# Patient Record
Sex: Female | Born: 1995 | Race: White | Hispanic: No | Marital: Married | Smoking: Never smoker
Health system: Southern US, Community
[De-identification: ages and names within clinical notes are randomized; demographics above are authoritative.]

## PROBLEM LIST (undated history)

## (undated) DIAGNOSIS — J45990 Exercise induced bronchospasm: Secondary | ICD-10-CM

## (undated) DIAGNOSIS — T7840XA Allergy, unspecified, initial encounter: Secondary | ICD-10-CM

## (undated) DIAGNOSIS — L309 Dermatitis, unspecified: Secondary | ICD-10-CM

## (undated) HISTORY — DX: Exercise induced bronchospasm: J45.990

## (undated) HISTORY — DX: Dermatitis, unspecified: L30.9

## (undated) HISTORY — DX: Allergy, unspecified, initial encounter: T78.40XA

## (undated) HISTORY — PX: NO PAST SURGERIES: SHX2092

---

## 2013-11-24 DIAGNOSIS — J45990 Exercise induced bronchospasm: Secondary | ICD-10-CM | POA: Insufficient documentation

## 2016-11-27 ENCOUNTER — Ambulatory Visit (INDEPENDENT_AMBULATORY_CARE_PROVIDER_SITE_OTHER): Payer: PRIVATE HEALTH INSURANCE | Admitting: Physician Assistant

## 2016-11-27 ENCOUNTER — Encounter: Payer: Self-pay | Admitting: Physician Assistant

## 2016-11-27 VITALS — BP 133/83 | HR 84 | Ht 62.0 in | Wt 137.0 lb

## 2016-11-27 DIAGNOSIS — N6323 Unspecified lump in the left breast, lower outer quadrant: Secondary | ICD-10-CM | POA: Insufficient documentation

## 2016-11-27 DIAGNOSIS — R21 Rash and other nonspecific skin eruption: Secondary | ICD-10-CM | POA: Diagnosis not present

## 2016-11-27 DIAGNOSIS — S2002XA Contusion of left breast, initial encounter: Secondary | ICD-10-CM | POA: Diagnosis not present

## 2016-11-27 DIAGNOSIS — Z975 Presence of (intrauterine) contraceptive device: Secondary | ICD-10-CM | POA: Insufficient documentation

## 2016-11-27 MED ORDER — CLOBETASOL PROPIONATE 0.05 % EX OINT: 1.0000 "application " | TOPICAL_OINTMENT | Freq: Two times a day (BID) | CUTANEOUS | 0 refills | Status: DC

## 2016-11-27 NOTE — Progress Notes (Signed)
HPI:                                                                Joy Wells is a 21 y.o. female who presents to [facility]: Primary Care Sports Medicine today to establish care   Current Concerns include breast lump  Patient reports she was playing with her puppy 2 days ago when the puppy jumped and his paw struck her in the left breast. Since that time she developed a palpable mass in her left lower outer breast. She also endorses tenderness that improved with Ibuprofen. She also noted some erythema of the skin in the overlying area. Denies fever, chills, warmth, or severe pain. Denies nipple discharge. She states she does regular self-breast exams and she is confident the lump presented itself after the trauma.  Additionally patient is concerned about a spot on her left lateral calf that has been present for approx. 3 months. She states it is occasionally pruritic, nontender, and has bled on a few occasions. She states that she was able to express pus from it once. She states she saw dermatology for this lesion and was given some Cetaphil moisturizer. She denies personal or family history of skin cancer.  Health Maintenance Health Maintenance  Topic Date Due  . HIV Screening  03/15/2009  . PAP SMEAR  03/16/2015  . INFLUENZA VACCINE  03/14/2017  . TETANUS/TDAP  02/09/2025    GYN/Sexual Health  Menstrual status: secondary amenorrhea (Nexplanon)  LMP: 4 years ago  Menses: n/a  Last pap smear: 2017, NILM ([facility])  History of abnormal pap smears: No  Sexually active: Yes  Current contraception: Nexplanon  Screening for STI's: declines  Health Habits  Diet: fair 1-2 cups coffee/tea/soda per day  Exercise: walking x 30 min daily  Past Medical History:  Diagnosis Date  . Allergy   . Asthma, exercise induced   . Eczema    Past Surgical History:  Procedure Laterality Date  . NO PAST SURGERIES     Social History  Substance Use  Topics  . Smoking status: Never Smoker  . Smokeless tobacco: Never Used  . Alcohol use Yes     Comment: 1 drink per month   family history includes Diabetes in her father; Hashimoto's thyroiditis in her mother; Hypertension in her father.  ROS: negative except as noted in the HPI  Medications: Current Outpatient Prescriptions  Medication Sig Dispense Refill  . albuterol (PROVENTIL HFA;VENTOLIN HFA) 108 (90 Base) MCG/ACT inhaler Inhale 2 puffs into the lungs every 6 (six) hours as needed.    . cetirizine (ZYRTEC) 10 MG tablet Take 10 mg by mouth daily.    Marland Kitchen etonogestrel (NEXPLANON) 68 MG IMPL implant 1 each by Subdermal route once.    . clobetasol ointment (TEMOVATE) 0.05 % Apply 1 application topically 2 (two) times daily. 30 g 0   No current facility-administered medications for this visit.    No Known Allergies     Objective:  BP 133/83   Pulse 84   Ht  (1.575 m)   Wt 137 lb (62.1 kg)   BMI 25.06 kg/m  Gen: well-groomed, cooperative, not ill-appearing, no distress Pulm: Normal work of breathing, normal phonation, clear to auscultation bilaterally CV: Normal rate, regular rhythm,  s1 and s2 distinct, no murmurs, clicks or rubs, no carotid bruit Left Breast: erythema of subareolar portion of the lower outer breast, approx. 2 cm firm, well-circumscribed, mobile mass in the subareolar region of 4:00-5:00 Neuro: alert and oriented x 3, EOM's intact, normal tone, no tremor MSK: moving all extremities, normal gait and station, no peripheral edema Skin: warm, dry, intact; approx. 0.8cm purple-hued blanchable scaly plaque on the left lateral lower extremity, no induration Psych: normal affect, euthymic mood, normal speech and thought content  Depression screen [facility] 2/9 11/27/2016  Decreased Interest 0  Down, Depressed, Hopeless 0  PHQ - 2 Score 0     Assessment and Plan: 21 y.o. female with   1. Rash and nonspecific skin eruption - DDx includes folliculitis, eczema,  lichen planus. If no improvement after 1 month of topical steroids, will biopsy  - clobetasol ointment (TEMOVATE) 0.05 %; Apply 1 application topically 2 (two) times daily.  Dispense: 30 g; Refill: 0 - follow-up in 1 month  2. Posttraumatic hematoma of left breast, initial encounter - reassurance provided - Ibuprofen  Q8h prn, Ice x 2-3 times daily  Patient education and anticipatory guidance given Patient agrees with treatment plan Follow-up in 1 month or sooner as needed  Levonne Hubert PA-C

## 2016-11-27 NOTE — Patient Instructions (Addendum)
- Ibuprofen  every 8 hours as needed for breast pain - Ice x 20 mins x 3 times daily - Follow-up in 1 month  - Apply Clobetasol ointment to affected area twice daily - Try to avoid shaving over the area - Follow-up in 1 month  Breast Tenderness Breast tenderness is a common problem for women of all ages. Breast tenderness may cause mild discomfort to severe pain. The pain usually comes and goes in association with your menstrual cycle, but it can be constant. Breast tenderness has many possible causes, including hormone changes and some medicines. Your health care provider may order tests, such as a mammogram or an ultrasound, to check for any unusual findings. Having breast tenderness usually does not mean that you have breast cancer. Follow these instructions at home: Sometimes, reassurance that you do not have breast cancer is all that is needed. In general, follow these home care instructions: Managing pain and discomfort   If directed, apply ice to the area:  Put ice in a plastic bag.  Place a towel between your skin and the bag.  Leave the ice on for 20 minutes, 2-3 times a day.  Make sure you are wearing a supportive bra, especially during exercise. You may also want to wear a supportive bra while sleeping if your breasts are very tender. Medicines   Take over-the-counter and prescription medicines only as told by your health care provider. If the cause of your pain is infection, you may be prescribed an antibiotic medicine.  If you were prescribed an antibiotic, take it as told by your health care provider. Do not stop taking the antibiotic even if you start to feel better. General instructions   Your health care provider may recommend that you reduce the amount of fat in your diet. You can do this by:  Limiting fried foods.  Cooking foods using methods, such as baking, boiling, grilling, and broiling.  Decrease the amount of caffeine in your diet. You can do this by  drinking more water and choosing caffeine-free options.  Keep a log of the days and times when your breasts are most tender.  Ask your health care provider how to do breast exams at home. This will help you notice if you have an unusual growth or lump. Contact a health care provider if:  Any part of your breast is hard, red, and hot to the touch. This may be a sign of infection.  You are not breastfeeding and you have fluid, especially blood or pus, coming out of your nipples.  You have a fever.  You have a new or painful lump in your breast that remains after your menstrual period ends.  Your pain does not improve or it gets worse.  Your pain is interfering with your daily activities. This information is not intended to replace advice given to you by your health care provider. Make sure you discuss any questions you have with your health care provider. Document Released: 07/13/2008 Document Revised: 04/28/2016 Document Reviewed: 04/28/2016 Elsevier Interactive Patient Education  2017 Elsevier Inc.   Lichen Planus Lichen planus is a skin problem that causes redness, itching, small bumps, and sores. It can affect the skin in any area of the body. Some common areas affected include:  Arms, wrists, legs, or ankles.  Chest, back, or abdomen.  Genital areas such as the vulva and vagina.  Gums and inside of the mouth.  Scalp.  Fingernails or toenails. Treatment can help control the symptoms of  this condition. The condition can last for a long time. It can take 6-18 months for it to go away. What are the causes? The exact cause of this condition is not known. The condition is not passed from one person to another (not contagious). It may be related to an allergy or an autoimmune response. An autoimmune response occurs when the body's defense system (immune system) mistakenly attacks healthy tissues. What increases the risk? This condition is more likely to develop in:  People who  are older than 23 years of age.  People who take certain medicines.  People who have been exposed to certain dyes or chemicals.  People with hepatitis C. What are the signs or symptoms? Symptoms of this condition may include:  Itching, which can be severe.  Small reddish or purple bumps on the skin. These may have flat tops and may be round or irregular shaped.  Redness or white patches on the gums or tongue.  Redness, soreness, or a burning feeling in the genital area. This may lead to pain or bleeding during sex.  Changes in the fingernails or toenails. The nails may become thin or rough. They may have ridges in them.  Redness or irritation of the eyes. This is rare. How is this diagnosed? This condition may be diagnosed based on:  A physical exam. The health care provider will examine your affected skin and check for changes inside your mouth.  Removal of a tissue sample (biopsy sample) to be looked at under a microscope. How is this treated? Treatment for this condition may depend on the severity of symptoms. In some cases, no treatment is needed. If treatment is needed to control symptoms, it may include:  Creams or ointments (topical steroids) to help control itching and irritation.  Medicine to be taken by mouth.  A treatment in which your skin is exposed to ultraviolet light (phototherapy).  Lozenges that you suck on to help treat sores in the mouth. Follow these instructions at home:  Take over-the-counter and prescription medicines only as told by your health care provider.  Use creams or ointments as told by your health care provider.  Do not scratch the affected areas of skin.  Women should keep the vaginal area as clean and dry as possible. Contact a health care provider if:  You have increasing redness, swelling, or pain in the affected area.  You have fluid, blood, or pus coming from the affected area.  Your eyes become irritated. This information  is not intended to replace advice given to you by your health care provider. Make sure you discuss any questions you have with your health care provider. Document Released: 12/22/2010 Document Revised: 01/06/2016 Document Reviewed: 10/26/2014 Elsevier Interactive Patient Education  2017 [employer].

## 2016-12-25 ENCOUNTER — Ambulatory Visit: Payer: PRIVATE HEALTH INSURANCE | Admitting: Physician Assistant

## 2018-03-11 ENCOUNTER — Other Ambulatory Visit: Payer: Self-pay

## 2018-03-11 ENCOUNTER — Emergency Department (INDEPENDENT_AMBULATORY_CARE_PROVIDER_SITE_OTHER)
Admission: EM | Admit: 2018-03-11 | Discharge: 2018-03-11 | Disposition: A | Discharge status: Home / Self Care | Payer: PRIVATE HEALTH INSURANCE | Source: Home / Self Care

## 2018-03-11 ENCOUNTER — Encounter: Payer: Self-pay | Admitting: Emergency Medicine

## 2018-03-11 DIAGNOSIS — J029 Acute pharyngitis, unspecified: Secondary | ICD-10-CM

## 2018-03-11 DIAGNOSIS — J069 Acute upper respiratory infection, unspecified: Secondary | ICD-10-CM

## 2018-03-11 LAB — POCT RAPID STREP A (OFFICE): Rapid Strep A Screen: NEGATIVE

## 2018-03-11 NOTE — Discharge Instructions (Signed)
Return if any problems.

## 2018-03-11 NOTE — ED Triage Notes (Signed)
Reports of headache, sore throat and fever over past 3 days despite OTCs; took acetaminophen 1000mg  at 0630.

## 2018-03-12 ENCOUNTER — Telehealth: Payer: Self-pay

## 2018-03-12 LAB — STREP A DNA PROBE: Group A Strep Probe: NOT DETECTED

## 2018-03-12 NOTE — Telephone Encounter (Signed)
Left message on VM with lab results and to call if any questions or concerns 

## 2018-03-12 NOTE — ED Provider Notes (Signed)
[facility]    CSN: [id number] Arrival date & time: 03/11/18  0815     History   Chief Complaint Chief Complaint  Patient presents with  . Headache  . Sore Throat  . Fever    HPI Joy Wells is a 22 y.o. female.   The history is provided by the patient. No language interpreter was used.  Headache  Pain location:  Generalized Radiates to:  Does not radiate Onset quality:  Gradual Duration:  3 days Timing:  Constant Progression:  Worsening Chronicity:  New Relieved by:  Nothing Worsened by:  Nothing Associated symptoms: fever   Sore Throat  Associated symptoms include headaches.  Fever  Associated symptoms: headaches     Past Medical History:  Diagnosis Date  . Allergy   . Asthma, exercise induced   . Eczema     Patient Active Problem List   Diagnosis Date Noted  . Mass of lower outer quadrant of left breast 11/27/2016  . Rash and nonspecific skin eruption 11/27/2016  . Nexplanon in place 11/27/2016    Past Surgical History:  Procedure Laterality Date  . NO PAST SURGERIES      OB History   None      Home Medications    Prior to Admission medications   Medication Sig Start Date End Date Taking? Authorizing Provider  albuterol (PROVENTIL HFA;VENTOLIN HFA) 108 (90 Base) MCG/ACT inhaler Inhale 2 puffs into the lungs every 6 (six) hours as needed.    [provider]  cetirizine (ZYRTEC) 10 MG tablet Take 10 mg by mouth daily.    [provider]  etonogestrel (NEXPLANON) 68 MG IMPL implant 1 each by Subdermal route once.    [provider]    Family History Family History  Problem Relation Age of Onset  . Hashimoto's thyroiditis Mother   . Hypertension Father   . Diabetes Father   . Heart attack Neg Hx   . Stroke Neg Hx   . Cancer Neg Hx     Social History Social History   Tobacco Use  . Smoking status: Never Smoker  . Smokeless tobacco: Never Used  Substance Use Topics  . Alcohol use: Yes   Comment: 1 drink per month  . Drug use: No     Allergies   Patient has no known allergies.   Review of Systems Review of Systems  Constitutional: Positive for fever.  Neurological: Positive for headaches.  All other systems reviewed and are negative.    Physical Exam Triage Vital Signs ED Triage Vitals  Enc Vitals Group     BP 03/11/18 0836 108/74     Pulse Rate 03/11/18 0836 (!) 107     Resp 03/11/18 0836 16     Temp 03/11/18 0836 98.6 F (37 C)     Temp Source 03/11/18 0836 Oral     SpO2 03/11/18 0836 99 %     Weight 03/11/18 0837 130 lb 5 oz (59.1 kg)     Height 03/11/18 0837 5\' 2"  (1.575 m)     Head Circumference --      Peak Flow --      Pain Score 03/11/18 0837 3     Pain Loc --      Pain Edu? --      Excl. in GC? --    No data found.  Updated Vital Signs BP 108/74 (BP Location: Right Arm)   Pulse (!) 107   Temp 98.6 F (37 C) (Oral)  Resp 16   Ht 5\' 2"  (1.575 m)   Wt 130 lb 5 oz (59.1 kg)   LMP 03/03/2018   SpO2 99%   BMI 23.83 kg/m   Visual Acuity Right Eye Distance:   Left Eye Distance:   Bilateral Distance:    Right Eye Near:   Left Eye Near:    Bilateral Near:     Physical Exam  Constitutional: She is oriented to person, place, and time. She appears well-developed and well-nourished.  HENT:  Head: Normocephalic.  Erythema throat no exudate  Eyes: EOM are normal.  Neck: Normal range of motion.  Pulmonary/Chest: Effort normal.  Abdominal: She exhibits no distension.  Musculoskeletal: Normal range of motion.  Neurological: She is alert and oriented to person, place, and time.  Psychiatric: She has a normal mood and affect.  Nursing note and vitals reviewed.    UC Treatments / Results  Labs (all labs ordered are listed, but only abnormal results are displayed) Labs Reviewed  STREP A DNA PROBE  POCT RAPID STREP A (OFFICE)    EKG None  Radiology No results found.  Procedures Procedures (including critical care  time)  Medications Ordered in UC Medications - No data to display  Initial Impression / Assessment and Plan / UC Course  I have reviewed the triage vital signs and the nursing notes.  Pertinent labs & imaging results that were available during my care of the patient were reviewed by me and considered in my medical decision making (see chart for details).      Final Clinical Impressions(s) / UC Diagnoses   Final diagnoses:  Pharyngitis, unspecified etiology  Upper respiratory tract infection, unspecified type     Discharge Instructions     Return if any problems   ED Prescriptions    None     Controlled Substance Prescriptions [place] Controlled Substance Registry consulted? Not Applicable   An After Visit Summary was printed and given to the patient.    Elson AreasSofia, Kellis Topete K, [place] JerseyPA-C 03/12/18 [phone]

## 2018-03-14 ENCOUNTER — Telehealth: Payer: Self-pay

## 2018-03-14 NOTE — Telephone Encounter (Signed)
Pt called and stated that she has white spots on throat and fever.  Spoke with Dario GuardianE. Phelps, PA-C and she stated that that could still be the result of a viral infection and signs to seek immediate medical attention.  Left message on patient's VM with the above information.

## 2019-05-29 ENCOUNTER — Encounter: Payer: Self-pay | Admitting: Family Medicine

## 2019-05-29 ENCOUNTER — Ambulatory Visit (INDEPENDENT_AMBULATORY_CARE_PROVIDER_SITE_OTHER): Payer: PRIVATE HEALTH INSURANCE | Admitting: Family Medicine

## 2019-05-29 VITALS — Temp 96.8°F | Wt 137.0 lb

## 2019-05-29 DIAGNOSIS — F411 Generalized anxiety disorder: Secondary | ICD-10-CM | POA: Insufficient documentation

## 2019-05-29 DIAGNOSIS — R110 Nausea: Secondary | ICD-10-CM | POA: Diagnosis not present

## 2019-05-29 MED ORDER — ONDANSETRON HCL 4 MG PO TABS: 4.0000 mg | ORAL_TABLET | Freq: Three times a day (TID) | ORAL | 0 refills | Status: DC | PRN

## 2019-05-29 MED ORDER — OMEPRAZOLE 40 MG PO CPDR: 40.0000 mg | DELAYED_RELEASE_CAPSULE | Freq: Every day | ORAL | 3 refills | Status: DC

## 2019-05-29 NOTE — Progress Notes (Signed)
Virtual Visit  via Video Note  I connected with      Joy Wells by a video enabled telemedicine application and verified that I am speaking with the correct person using two identifiers.   I discussed the limitations of evaluation and management by telemedicine and the availability of in person appointments. The patient expressed understanding and agreed to proceed.  History of Present Illness: Joy Wells is a 23 y.o. female who would like to discuss nausea.  Patient notes a 2-week history of intermittent nausea.  Can be somewhat bothersome at times.  She denies any vomiting abdominal pain diarrhea fevers or chills cough or congestion.  She is tried taking 20 mg of omeprazole over-the-counter which did not help.  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  She notes that she has Nexplanon for contraception and has irregular bleeding therefore does not know exactly when her last menstrual period was.     Observations/Objective: Temp (!) 96.8 F (36 C) (Oral)   Wt 137 lb (62.1 kg)   BMI 25.06 kg/m  Wt Readings from Last 5 Encounters:  05/29/19 137 lb (62.1 kg)  03/11/18 130 lb 5 oz (59.1 kg)  11/27/16 137 lb (62.1 kg)   Exam: Appearance nontoxic Normal Speech.  Alert and oriented normal speech thought process and affect.  Depression screen [facility] 2/9 05/29/2019 11/27/2016  Decreased Interest 0 0  Down, Depressed, Hopeless 1 0  PHQ - 2 Score 1 0  Altered sleeping 1 -  Tired, decreased energy 2 -  Change in appetite 2 -  Feeling bad or failure about yourself  0 -  Trouble concentrating 1 -  Moving slowly or fidgety/restless 1 -  Suicidal thoughts 0 -  PHQ-9 Score 8 -  Difficult doing work/chores Somewhat difficult -   GAD 7 : Generalized Anxiety Score 05/29/2019  Nervous, Anxious, on Edge 1  Control/stop worrying 2  Worry too much -  different things 2  Trouble relaxing 1  Restless 1  Easily annoyed or irritable 1  Afraid - awful might happen 2  Total GAD 7 Score 10  Anxiety Difficulty Somewhat difficult     Lab and Radiology Results No results found for this or any previous visit (from the past 72 hour(s)). No results found.   Assessment and Plan: 23 y.o. female with nausea.  Unclear etiology.  Likely related to anxiety as patient does have worsening GAD 7 scores.  However certainly could be some metabolic issues as well.  Plan for trial of Zofran and omeprazole.  Discussed anxiety treatment and will use therapy.  If not improving in the next week or so she will let us know and we will proceed with lab metabolic work-up.  Recommend over-the-counter urine pregnancy test as well as outpatient COVID-19 test.  Recheck in person if needed.  PDMP not reviewed this encounter. No orders of the defined types were placed in this encounter.  No orders of the defined types were placed in this encounter.   Follow Up Instructions:    I discussed the assessment and treatment plan with the patient. The patient was provided an opportunity to ask questions and all were answered. The patient agreed with the plan and demonstrated an understanding of the instructions.   The patient was advised to call back or seek an  in-person evaluation if the symptoms worsen or if the condition fails to improve as anticipated.  Time: 15 minutes of intraservice time, with >22 minutes of total time during today's visit.      Historical information moved to improve visibility of documentation.  Past Medical History:  Diagnosis Date  . Allergy   . Asthma, exercise induced   . Eczema    Past Surgical History:  Procedure Laterality Date  . NO PAST SURGERIES     Social History   Tobacco Use  . Smoking status: Never Smoker  . Smokeless tobacco: Never Used  Substance Use Topics  . Alcohol use: Yes    Comment: 1 drink per month    family history includes Diabetes in her father; Hashimoto's thyroiditis in her mother; Hypertension in her father.  Medications: Current Outpatient Medications  Medication Sig Dispense Refill  . albuterol (PROVENTIL HFA;VENTOLIN HFA) 108 (90 Base) MCG/ACT inhaler Inhale 2 puffs into the lungs every 6 (six) hours as needed.    . cetirizine (ZYRTEC) 10 MG tablet Take 10 mg by mouth daily.    Marland Kitchen etonogestrel (NEXPLANON) 68 MG IMPL implant 1 each by Subdermal route once.     No current facility-administered medications for this visit.    No Known Allergies

## 2019-06-30 ENCOUNTER — Ambulatory Visit (INDEPENDENT_AMBULATORY_CARE_PROVIDER_SITE_OTHER): Payer: PRIVATE HEALTH INSURANCE | Admitting: Professional

## 2019-06-30 DIAGNOSIS — F411 Generalized anxiety disorder: Secondary | ICD-10-CM

## 2019-07-18 ENCOUNTER — Ambulatory Visit: Payer: PRIVATE HEALTH INSURANCE | Admitting: Professional

## 2019-07-23 ENCOUNTER — Ambulatory Visit (INDEPENDENT_AMBULATORY_CARE_PROVIDER_SITE_OTHER): Payer: PRIVATE HEALTH INSURANCE | Admitting: Professional

## 2019-07-23 DIAGNOSIS — F321 Major depressive disorder, single episode, moderate: Secondary | ICD-10-CM

## 2019-07-23 DIAGNOSIS — F4322 Adjustment disorder with anxiety: Secondary | ICD-10-CM

## 2019-07-30 ENCOUNTER — Ambulatory Visit (INDEPENDENT_AMBULATORY_CARE_PROVIDER_SITE_OTHER): Payer: PRIVATE HEALTH INSURANCE | Admitting: Professional

## 2019-07-30 DIAGNOSIS — F4322 Adjustment disorder with anxiety: Secondary | ICD-10-CM

## 2019-07-30 DIAGNOSIS — F321 Major depressive disorder, single episode, moderate: Secondary | ICD-10-CM

## 2019-08-04 ENCOUNTER — Encounter: Payer: Self-pay | Admitting: Family Medicine

## 2019-08-05 MED ORDER — ALBUTEROL SULFATE HFA 108 (90 BASE) MCG/ACT IN AERS: 2.0000 | INHALATION_SPRAY | Freq: Four times a day (QID) | RESPIRATORY_TRACT | 0 refills | Status: DC | PRN

## 2019-08-05 NOTE — Addendum Note (Signed)
Addended by: Towana Badger on: 08/05/2019 09:37 AM   Modules accepted: Orders

## 2019-08-11 ENCOUNTER — Ambulatory Visit: Payer: PRIVATE HEALTH INSURANCE | Admitting: Professional

## 2019-08-20 ENCOUNTER — Ambulatory Visit (INDEPENDENT_AMBULATORY_CARE_PROVIDER_SITE_OTHER): Payer: PRIVATE HEALTH INSURANCE | Admitting: Professional

## 2019-08-20 DIAGNOSIS — F4322 Adjustment disorder with anxiety: Secondary | ICD-10-CM

## 2019-08-20 DIAGNOSIS — F321 Major depressive disorder, single episode, moderate: Secondary | ICD-10-CM

## 2019-09-01 ENCOUNTER — Ambulatory Visit (INDEPENDENT_AMBULATORY_CARE_PROVIDER_SITE_OTHER): Payer: PRIVATE HEALTH INSURANCE | Admitting: Professional

## 2019-09-01 DIAGNOSIS — F321 Major depressive disorder, single episode, moderate: Secondary | ICD-10-CM | POA: Diagnosis not present

## 2019-09-01 DIAGNOSIS — F4322 Adjustment disorder with anxiety: Secondary | ICD-10-CM

## 2019-09-15 ENCOUNTER — Ambulatory Visit: Payer: PRIVATE HEALTH INSURANCE | Admitting: Professional

## 2019-09-29 ENCOUNTER — Ambulatory Visit: Payer: PRIVATE HEALTH INSURANCE | Admitting: Professional

## 2019-10-13 ENCOUNTER — Ambulatory Visit: Payer: PRIVATE HEALTH INSURANCE | Admitting: Professional

## 2019-11-03 ENCOUNTER — Other Ambulatory Visit: Payer: Self-pay | Admitting: Family Medicine

## 2019-11-16 ENCOUNTER — Encounter: Payer: Self-pay | Admitting: Family Medicine

## 2019-11-18 MED ORDER — OMEPRAZOLE 40 MG PO CPDR: 40.0000 mg | DELAYED_RELEASE_CAPSULE | Freq: Every day | ORAL | 3 refills | Status: DC

## 2020-04-01 DIAGNOSIS — Z3042 Encounter for surveillance of injectable contraceptive: Secondary | ICD-10-CM | POA: Diagnosis not present

## 2020-06-21 DIAGNOSIS — Z3042 Encounter for surveillance of injectable contraceptive: Secondary | ICD-10-CM | POA: Diagnosis not present

## 2020-06-21 DIAGNOSIS — Z01812 Encounter for preprocedural laboratory examination: Secondary | ICD-10-CM | POA: Diagnosis not present

## 2020-09-03 DIAGNOSIS — M546 Pain in thoracic spine: Secondary | ICD-10-CM | POA: Diagnosis not present

## 2020-09-13 DIAGNOSIS — Z3042 Encounter for surveillance of injectable contraceptive: Secondary | ICD-10-CM | POA: Diagnosis not present

## 2020-10-13 DIAGNOSIS — Z01419 Encounter for gynecological examination (general) (routine) without abnormal findings: Secondary | ICD-10-CM | POA: Diagnosis not present

## 2020-10-13 DIAGNOSIS — Z6826 Body mass index (BMI) 26.0-26.9, adult: Secondary | ICD-10-CM | POA: Diagnosis not present

## 2020-10-13 DIAGNOSIS — Z3042 Encounter for surveillance of injectable contraceptive: Secondary | ICD-10-CM | POA: Diagnosis not present

## 2020-10-13 DIAGNOSIS — Z124 Encounter for screening for malignant neoplasm of cervix: Secondary | ICD-10-CM | POA: Diagnosis not present

## 2020-10-13 LAB — HM PAP SMEAR: HM Pap smear: NEGATIVE

## 2020-12-06 DIAGNOSIS — Z3042 Encounter for surveillance of injectable contraceptive: Secondary | ICD-10-CM | POA: Diagnosis not present

## 2021-02-21 DIAGNOSIS — Z3042 Encounter for surveillance of injectable contraceptive: Secondary | ICD-10-CM | POA: Diagnosis not present

## 2021-05-09 ENCOUNTER — Encounter: Payer: Self-pay | Admitting: Family Medicine

## 2021-05-09 ENCOUNTER — Other Ambulatory Visit: Payer: Self-pay

## 2021-05-09 ENCOUNTER — Ambulatory Visit (INDEPENDENT_AMBULATORY_CARE_PROVIDER_SITE_OTHER): Payer: BC Managed Care – PPO | Admitting: Family Medicine

## 2021-05-09 VITALS — BP 128/81 | HR 89 | Temp 97.4°F | Ht 62.0 in | Wt 157.0 lb

## 2021-05-09 DIAGNOSIS — Z3042 Encounter for surveillance of injectable contraceptive: Secondary | ICD-10-CM | POA: Diagnosis not present

## 2021-05-09 DIAGNOSIS — R5383 Other fatigue: Secondary | ICD-10-CM | POA: Diagnosis not present

## 2021-05-09 DIAGNOSIS — Z23 Encounter for immunization: Secondary | ICD-10-CM

## 2021-05-09 DIAGNOSIS — Z0000 Encounter for general adult medical examination without abnormal findings: Secondary | ICD-10-CM

## 2021-05-09 DIAGNOSIS — Z13220 Encounter for screening for lipoid disorders: Secondary | ICD-10-CM | POA: Diagnosis not present

## 2021-05-09 DIAGNOSIS — J45990 Exercise induced bronchospasm: Secondary | ICD-10-CM

## 2021-05-09 MED ORDER — ALBUTEROL SULFATE HFA 108 (90 BASE) MCG/ACT IN AERS: 2.0000 | INHALATION_SPRAY | Freq: Four times a day (QID) | RESPIRATORY_TRACT | 0 refills | Status: DC | PRN

## 2021-05-09 NOTE — Assessment & Plan Note (Signed)
Well adult Orders Placed This Encounter  Procedures  . Flu Vaccine QUAD 6+ mos PF IM (Fluarix Quad PF)  . COMPLETE METABOLIC PANEL WITH GFR  . CBC with Differential  . Lipid Panel w/reflex Direct LDL  . TSH  Screenings: Lipid panel Immunizations flu vaccine Anticipatory guidance/risk factor reduction: Recommendations per AVS.

## 2021-05-09 NOTE — Progress Notes (Signed)
Joy Wells - 25 y.o. female MRN [id number]  Date of birth: [birth year]  Subjective Chief Complaint  Patient presents with   Establish Care    HPI Case is a 25 year old female here today for initial visit to establish care.  She has a history of asthma and eczema but has otherwise been in good health.  She would like a renewal on her albuterol inhaler today.  She would like to have updated labs in addition to annual exam today.  She would like to have updated flu shot.  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  She enjoys this.  She is a non-smoker.  She rarely consumes alcohol.  She does exercise regularly.  Review of Systems  Constitutional:  Negative for chills, fever, malaise/fatigue and weight loss.  HENT:  Negative for congestion, ear pain and sore throat.   Eyes:  Negative for blurred vision, double vision and pain.  Respiratory:  Negative for cough and shortness of breath.   Cardiovascular:  Negative for chest pain and palpitations.  Gastrointestinal:  Negative for abdominal pain, blood in stool, constipation, heartburn and nausea.  Genitourinary:  Negative for dysuria and urgency.  Musculoskeletal:  Negative for joint pain and myalgias.  Neurological:  Negative for dizziness and headaches.  Endo/Heme/Allergies:  Does not bruise/bleed easily.  Psychiatric/Behavioral:  Negative for depression. The patient is not nervous/anxious and does not have insomnia.    No Known Allergies  Past Medical History:  Diagnosis Date   Allergy    Asthma, exercise induced    Eczema     Past Surgical History:  Procedure Laterality Date   NO PAST SURGERIES      Social History   Socioeconomic History   Marital status: Married    Spouse name: Not on file   Number of children: Not on file   Years of education: Not on file   Highest education level: Not on file  Occupational History   Not on file  Tobacco Use   Smoking status: Never   Smokeless tobacco: Never  Substance and  Sexual Activity   Alcohol use: Yes    Comment: 1 drink per month   Drug use: No   Sexual activity: Yes    Birth control/protection: Implant  Other Topics Concern   Not on file  Social History Narrative   Not on file   Social Determinants of Health   Financial Resource Strain: Not on file  Food Insecurity: Not on file  Transportation Needs: Not on file  Physical Activity: Not on file  Stress: Not on file  Social Connections: Not on file    Family History  Problem Relation Age of Onset   Hashimoto's thyroiditis Mother    Hypertension Father    Diabetes Father    Heart attack Neg Hx    Stroke Neg Hx    Cancer Neg Hx     Health Maintenance  Topic Date Due   COVID-19 Vaccine (1) Never done   HIV Screening  Never done   Hepatitis C Screening  Never done   PAP-Cervical Cytology Screening  10/14/2023   PAP SMEAR-Modifier  10/14/2023   TETANUS/TDAP  02/09/2025   INFLUENZA VACCINE  Completed   HPV VACCINES  Aged Out     ----------------------------------------------------------------------------------------------------------------------------------------------------------------------------------------------------------------- Physical Exam BP 128/81 (BP Location: Left Arm, Patient Position: Sitting, Cuff Size: Normal)   Pulse 89   Temp (!) 97.4 F (36.3 C)   Ht 5\' 2"  (1.575 m)   Wt 157 lb (71.2 kg)  LMP  (LMP Unknown)   SpO2 98%   BMI 28.72 kg/m   Physical Exam Constitutional:      General: She is not in acute distress. HENT:     Head: Normocephalic and atraumatic.     Right Ear: Tympanic membrane and ear canal normal.     Left Ear: Tympanic membrane and ear canal normal.     Nose: Nose normal.  Eyes:     General: No scleral icterus.    Conjunctiva/sclera: Conjunctivae normal.  Neck:     Thyroid: No thyromegaly.  Cardiovascular:     Rate and Rhythm: Normal rate and regular rhythm.     Heart sounds: Normal heart sounds.  Pulmonary:     Effort: Pulmonary  effort is normal.     Breath sounds: Normal breath sounds.  Abdominal:     General: Bowel sounds are normal. There is no distension.     Palpations: Abdomen is soft.     Tenderness: There is no abdominal tenderness. There is no guarding.  Musculoskeletal:        General: Normal range of motion.     Cervical back: Normal range of motion and neck supple.  Lymphadenopathy:     Cervical: No cervical adenopathy.  Skin:    General: Skin is warm and dry.     Findings: No rash.  Neurological:     General: No focal deficit present.     Mental Status: She is alert and oriented to person, place, and time.     Cranial Nerves: No cranial nerve deficit.     Coordination: Coordination normal.  Psychiatric:        Mood and Affect: Mood normal.        Behavior: Behavior normal.    ------------------------------------------------------------------------------------------------------------------------------------------------------------------------------------------------------------------- Assessment and Plan  Well adult exam Well adult Orders Placed This Encounter  Procedures   Flu Vaccine QUAD 6+ mos PF IM (Fluarix Quad PF)   COMPLETE METABOLIC PANEL WITH GFR   CBC with Differential   Lipid Panel w/reflex Direct LDL   TSH  Screenings: Lipid panel Immunizations flu vaccine Anticipatory guidance/risk factor reduction: Recommendations per AVS.  Exercise-induced asthma Albuterol inhaler renewed.   Meds ordered this encounter  Medications   albuterol (VENTOLIN HFA) 108 (90 Base) MCG/ACT inhaler    Sig: Inhale 2 puffs into the lungs every 6 (six) hours as needed.    Dispense:  18 g    Refill:  0    No follow-ups on file.    This visit occurred during the SARS-CoV-2 public health emergency.  Safety protocols were in place, including screening questions prior to the visit, additional usage of staff PPE, and extensive cleaning of exam room while observing appropriate contact time as  indicated for disinfecting solutions.

## 2021-05-09 NOTE — Assessment & Plan Note (Signed)
Albuterol inhaler renewed.

## 2021-05-09 NOTE — Patient Instructions (Signed)
Preventive Care 21-27 Years Old, Female Preventive care refers to lifestyle choices and visits with your health care provider that can promote health and wellness. This includes: A yearly physical exam. This is also called an annual wellness visit. Regular dental and eye exams. Immunizations. Screening for certain conditions. Healthy lifestyle choices, such as: Eating a healthy diet. Getting regular exercise. Not using drugs or products that contain nicotine and tobacco. Limiting alcohol use. What can I expect for my preventive care visit? Physical exam Your health care provider may check your: Height and weight. These may be used to calculate your BMI (body mass index). BMI is a measurement that tells if you are at a healthy weight. Heart rate and blood pressure. Body temperature. Skin for abnormal spots. Counseling Your health care provider may ask you questions about your: Past medical problems. Family's medical history. Alcohol, tobacco, and drug use. Emotional well-being. Home life and relationship well-being. Sexual activity. Diet, exercise, and sleep habits. Work and work environment. Access to firearms. Method of birth control. Menstrual cycle. Pregnancy history. What immunizations do I need? Vaccines are usually given at various ages, according to a schedule. Your health care provider will recommend vaccines for you based on your age, medical history, and lifestyle or other factors, such as travel or where you work. What tests do I need? Blood tests Lipid and cholesterol levels. These may be checked every 5 years starting at age 20. Hepatitis C test. Hepatitis B test. Screening Diabetes screening. This is done by checking your blood sugar (glucose) after you have not eaten for a while (fasting). STD (sexually transmitted disease) testing, if you are at risk. BRCA-related cancer screening. This may be done if you have a family history of breast, ovarian, tubal, or  peritoneal cancers. Pelvic exam and Pap test. This may be done every 3 years starting at age 21. Starting at age 30, this may be done every 5 years if you have a Pap test in combination with an HPV test. Talk with your health care provider about your test results, treatment options, and if necessary, the need for more tests. Follow these instructions at home: Eating and drinking  Eat a healthy diet that includes fresh fruits and vegetables, whole grains, lean protein, and low-fat dairy products. Take vitamin and mineral supplements as recommended by your health care provider. Do not drink alcohol if: Your health care provider tells you not to drink. You are pregnant, may be pregnant, or are planning to become pregnant. If you drink alcohol: Limit how much you have to 0-1 drink a day. Be aware of how much alcohol is in your drink. In the [place] one drink equals one 12 oz bottle of beer (355 mL), one 5 oz glass of wine (148 mL), or one 1 oz glass of hard liquor (44 mL). Lifestyle Take daily care of your teeth and gums. Brush your teeth every morning and night with fluoride toothpaste. Floss one time each day. Stay active. Exercise for at least 30 minutes 5 or more days each week. Do not use any products that contain nicotine or tobacco, such as cigarettes, e-cigarettes, and chewing tobacco. If you need help quitting, ask your health care provider. Do not use drugs. If you are sexually active, practice safe sex. Use a condom or other form of protection to prevent STIs (sexually transmitted infections). If you do not wish to become pregnant, use a form of birth control. If you plan to become pregnant, see your health care provider   for a prepregnancy visit. Find healthy ways to cope with stress, such as: Meditation, yoga, or listening to music. Journaling. Talking to a trusted person. Spending time with friends and family. Safety Always wear your seat belt while driving or riding in a  vehicle. Do not drive: If you have been drinking alcohol. Do not ride with someone who has been drinking. When you are tired or distracted. While texting. Wear a helmet and other protective equipment during sports activities. If you have firearms in your house, make sure you follow all gun safety procedures. Seek help if you have been physically or sexually abused. What's next? Go to your health care provider once a year for an annual wellness visit. Ask your health care provider how often you should have your eyes and teeth checked. Stay up to date on all vaccines. This information is not intended to replace advice given to you by your health care provider. Make sure you discuss any questions you have with your health care provider. Document Revised: 10/08/2020 Document Reviewed: 04/11/2018 Elsevier Patient Education  2022 Elsevier Inc.  

## 2021-05-10 LAB — CBC WITH DIFFERENTIAL/PLATELET
Absolute Monocytes: 574 cells/uL (ref 200–950)
Basophils Absolute: 49 cells/uL (ref 0–200)
Basophils Relative: 0.7 %
Eosinophils Absolute: 329 cells/uL (ref 15–500)
Eosinophils Relative: 4.7 %
HCT: 43.1 % (ref 35.0–45.0)
Hemoglobin: 14.3 g/dL (ref 11.7–15.5)
Lymphs Abs: 2205 cells/uL (ref 850–3900)
MCH: 29.7 pg (ref 27.0–33.0)
MCHC: 33.2 g/dL (ref 32.0–36.0)
MCV: 89.4 fL (ref 80.0–100.0)
MPV: 11.4 fL (ref 7.5–12.5)
Monocytes Relative: 8.2 %
Neutro Abs: 3843 cells/uL (ref 1500–7800)
Neutrophils Relative %: 54.9 %
Platelets: 239 10*3/uL (ref 140–400)
RBC: 4.82 10*6/uL (ref 3.80–5.10)
RDW: 11.7 % (ref 11.0–15.0)
Total Lymphocyte: 31.5 %
WBC: 7 10*3/uL (ref 3.8–10.8)

## 2021-05-10 LAB — COMPLETE METABOLIC PANEL WITH GFR
AG Ratio: 1.5 (calc) (ref 1.0–2.5)
ALT: 17 U/L (ref 6–29)
AST: 20 U/L (ref 10–30)
Albumin: 4.5 g/dL (ref 3.6–5.1)
Alkaline phosphatase (APISO): 73 U/L (ref 31–125)
BUN: 18 mg/dL (ref 7–25)
CO2: 22 mmol/L (ref 20–32)
Calcium: 9.5 mg/dL (ref 8.6–10.2)
Chloride: 108 mmol/L (ref 98–110)
Creat: 0.96 mg/dL (ref 0.50–0.96)
Globulin: 3 g/dL (calc) (ref 1.9–3.7)
Glucose, Bld: 96 mg/dL (ref 65–99)
Potassium: 4.2 mmol/L (ref 3.5–5.3)
Sodium: 140 mmol/L (ref 135–146)
Total Bilirubin: 0.4 mg/dL (ref 0.2–1.2)
Total Protein: 7.5 g/dL (ref 6.1–8.1)
eGFR: 83 mL/min/{1.73_m2} (ref >=60)

## 2021-05-10 LAB — LIPID PANEL W/REFLEX DIRECT LDL
Cholesterol: 158 mg/dL (ref <200)
HDL: 51 mg/dL (ref >=50)
LDL Cholesterol (Calc): 92 mg/dL (calc)
Non-HDL Cholesterol (Calc): 107 mg/dL (calc) (ref <130)
Total CHOL/HDL Ratio: 3.1 (calc) (ref <5.0)
Triglycerides: 64 mg/dL (ref <150)

## 2021-05-10 LAB — TSH: TSH: 2.34 mIU/L

## 2021-05-31 ENCOUNTER — Other Ambulatory Visit: Payer: Self-pay | Admitting: Family Medicine

## 2021-08-02 DIAGNOSIS — Z3042 Encounter for surveillance of injectable contraceptive: Secondary | ICD-10-CM | POA: Diagnosis not present

## 2021-08-09 ENCOUNTER — Other Ambulatory Visit: Payer: Self-pay | Admitting: Family Medicine

## 2021-10-04 ENCOUNTER — Ambulatory Visit (INDEPENDENT_AMBULATORY_CARE_PROVIDER_SITE_OTHER): Payer: BC Managed Care – PPO

## 2021-10-04 ENCOUNTER — Ambulatory Visit (INDEPENDENT_AMBULATORY_CARE_PROVIDER_SITE_OTHER): Payer: BC Managed Care – PPO | Admitting: Family Medicine

## 2021-10-04 ENCOUNTER — Encounter: Payer: Self-pay | Admitting: Family Medicine

## 2021-10-04 ENCOUNTER — Other Ambulatory Visit: Payer: Self-pay

## 2021-10-04 VITALS — BP 140/79 | HR 81 | Ht 62.0 in | Wt 166.0 lb

## 2021-10-04 DIAGNOSIS — R5383 Other fatigue: Secondary | ICD-10-CM

## 2021-10-04 DIAGNOSIS — R61 Generalized hyperhidrosis: Secondary | ICD-10-CM

## 2021-10-04 NOTE — Assessment & Plan Note (Signed)
Orders Placed This Encounter  Procedures   DG Chest 2 View    Standing Status:   Future    Number of Occurrences:   1    Standing Expiration Date:   10/04/2022    Order Specific Question:   Reason for Exam (SYMPTOM  OR DIAGNOSIS REQUIRED)    Answer:   night sweats, fatigue    Order Specific Question:   Is patient pregnant?    Answer:   No    Order Specific Question:   Preferred imaging location?    Answer:   Product/process development scientist   COMPLETE METABOLIC PANEL WITH GFR   CBC with Differential   Iron, TIBC and Ferritin Panel   TSH   Sed Rate (ESR)   C-reactive protein   Urinalysis with Culture Reflex   T4, free   T3, free  Discussed her this could be related to viral illness however checking labs per orders.  We will also order chest x-ray.

## 2021-10-04 NOTE — Progress Notes (Signed)
Joy Wells - 26 y.o. female MRN [id number]  Date of birth: 1995/11/19  Subjective Chief Complaint  Patient presents with   Fatigue   Night Sweats   Leg Pain    HPI Joy Wells is a 26 year old female here today with complaint of fatigue.  This is associated with night sweats.  This started approximately 2 weeks ago.  She has been soaking the sheets with night sweats.  Fatigue is generalized.  She has had some pain and bilateral lower legs, feels like bone pain.  Denies low back pain, weakness, numbness or tingling.  She has not had any fever, chills, shortness of breath or palpitations.  She denies any recent cold or flulike symptoms.  ROS:  A comprehensive ROS was completed and negative except as noted per HPI  No Known Allergies  Past Medical History:  Diagnosis Date   Allergy    Asthma, exercise induced    Eczema     Past Surgical History:  Procedure Laterality Date   NO PAST SURGERIES      Social History   Socioeconomic History   Marital status: Married    Spouse name: Not on file   Number of children: Not on file   Years of education: Not on file   Highest education level: Not on file  Occupational History   Not on file  Tobacco Use   Smoking status: Never   Smokeless tobacco: Never  Substance and Sexual Activity   Alcohol use: Yes    Comment: 1 drink per month   Drug use: No   Sexual activity: Yes    Birth control/protection: Implant  Other Topics Concern   Not on file  Social History Narrative   Not on file   Social Determinants of Health   Financial Resource Strain: Not on file  Food Insecurity: Not on file  Transportation Needs: Not on file  Physical Activity: Not on file  Stress: Not on file  Social Connections: Not on file    Family History  Problem Relation Age of Onset   Hashimoto's thyroiditis Mother    Hypertension Father    Diabetes Father    Heart attack Neg Hx    Stroke Neg Hx    Cancer Neg Hx     Health Maintenance  Topic  Date Due   HIV Screening  Never done   Hepatitis C Screening  Never done   COVID-19 Vaccine (1) 10/20/2021 (Originally 09/15/1996)   PAP-Cervical Cytology Screening  10/14/2023   PAP SMEAR-Modifier  10/14/2023   TETANUS/TDAP  02/09/2025   INFLUENZA VACCINE  Completed   HPV VACCINES  Aged Out     ----------------------------------------------------------------------------------------------------------------------------------------------------------------------------------------------------------------- Physical Exam BP 140/79 (BP Location: Left Arm, Patient Position: Sitting, Cuff Size: Normal)    Pulse 81    Ht '5\' 2"'  (1.575 m)    Wt 166 lb (75.3 kg)    SpO2 98%    BMI 30.36 kg/m   Physical Exam Constitutional:      Appearance: Normal appearance.  Eyes:     General: No scleral icterus. Cardiovascular:     Rate and Rhythm: Normal rate and regular rhythm.  Pulmonary:     Effort: Pulmonary effort is normal.     Breath sounds: Normal breath sounds.  Musculoskeletal:     Cervical back: Neck supple.  Lymphadenopathy:     Cervical: No cervical adenopathy.  Skin:    General: Skin is warm and dry.  Neurological:     Mental Status: She is alert.  Psychiatric:        Mood and Affect: Mood normal.        Behavior: Behavior normal.    ------------------------------------------------------------------------------------------------------------------------------------------------------------------------------------------------------------------- Assessment and Plan  Night sweats Orders Placed This Encounter  Procedures   DG Chest 2 View    Standing Status:   Future    Number of Occurrences:   1    Standing Expiration Date:   10/04/2022    Order Specific Question:   Reason for Exam (SYMPTOM  OR DIAGNOSIS REQUIRED)    Answer:   night sweats, fatigue    Order Specific Question:   Is patient pregnant?    Answer:   No    Order Specific Question:   Preferred imaging location?    Answer:    Product/process development scientist   COMPLETE METABOLIC PANEL WITH GFR   CBC with Differential   Iron, TIBC and Ferritin Panel   TSH   Sed Rate (ESR)   C-reactive protein   Urinalysis with Culture Reflex   T4, free   T3, free  Discussed her this could be related to viral illness however checking labs per orders.  We will also order chest x-ray.   No orders of the defined types were placed in this encounter.   No follow-ups on file.    This visit occurred during the SARS-CoV-2 public health emergency.  Safety protocols were in place, including screening questions prior to the visit, additional usage of staff PPE, and extensive cleaning of exam room while observing appropriate contact time as indicated for disinfecting solutions.

## 2021-10-05 LAB — CBC WITH DIFFERENTIAL/PLATELET
Absolute Monocytes: 554 cells/uL (ref 200–950)
Basophils Absolute: 53 cells/uL (ref 0–200)
Basophils Relative: 0.8 %
Eosinophils Absolute: 304 cells/uL (ref 15–500)
Eosinophils Relative: 4.6 %
HCT: 42.6 % (ref 35.0–45.0)
Hemoglobin: 14 g/dL (ref 11.7–15.5)
Lymphs Abs: 1987 cells/uL (ref 850–3900)
MCH: 29.5 pg (ref 27.0–33.0)
MCHC: 32.9 g/dL (ref 32.0–36.0)
MCV: 89.7 fL (ref 80.0–100.0)
MPV: 11.2 fL (ref 7.5–12.5)
Monocytes Relative: 8.4 %
Neutro Abs: 3703 cells/uL (ref 1500–7800)
Neutrophils Relative %: 56.1 %
Platelets: 222 10*3/uL (ref 140–400)
RBC: 4.75 10*6/uL (ref 3.80–5.10)
RDW: 11.8 % (ref 11.0–15.0)
Total Lymphocyte: 30.1 %
WBC: 6.6 10*3/uL (ref 3.8–10.8)

## 2021-10-05 LAB — COMPLETE METABOLIC PANEL WITH GFR
AG Ratio: 1.6 (calc) (ref 1.0–2.5)
ALT: 18 U/L (ref 6–29)
AST: 19 U/L (ref 10–30)
Albumin: 4.4 g/dL (ref 3.6–5.1)
Alkaline phosphatase (APISO): 72 U/L (ref 31–125)
BUN: 16 mg/dL (ref 7–25)
CO2: 25 mmol/L (ref 20–32)
Calcium: 9.5 mg/dL (ref 8.6–10.2)
Chloride: 104 mmol/L (ref 98–110)
Creat: 0.82 mg/dL (ref 0.50–0.96)
Globulin: 2.7 g/dL (calc) (ref 1.9–3.7)
Glucose, Bld: 95 mg/dL (ref 65–99)
Potassium: 4 mmol/L (ref 3.5–5.3)
Sodium: 139 mmol/L (ref 135–146)
Total Bilirubin: 0.4 mg/dL (ref 0.2–1.2)
Total Protein: 7.1 g/dL (ref 6.1–8.1)
eGFR: 100 mL/min/{1.73_m2} (ref >=60)

## 2021-10-05 LAB — URINALYSIS W MICROSCOPIC + REFLEX CULTURE
Bilirubin Urine: NEGATIVE
Glucose, UA: NEGATIVE
Hgb urine dipstick: NEGATIVE
Hyaline Cast: NONE SEEN /LPF
Ketones, ur: NEGATIVE
Leukocyte Esterase: NEGATIVE
Nitrites, Initial: NEGATIVE
Protein, ur: NEGATIVE
Specific Gravity, Urine: 1.026 (ref 1.001–1.035)
Squamous Epithelial / HPF: NONE SEEN /HPF (ref <=5)
WBC, UA: NONE SEEN /HPF (ref 0–5)
pH: 7.5 (ref 5.0–8.0)

## 2021-10-05 LAB — IRON,TIBC AND FERRITIN PANEL
%SAT: 45 % (calc) (ref 16–45)
Ferritin: 32 ng/mL (ref 16–154)
Iron: 149 ug/dL (ref 40–190)
TIBC: 332 mcg/dL (calc) (ref 250–450)

## 2021-10-05 LAB — SEDIMENTATION RATE: Sed Rate: 11 mm/h (ref 0–20)

## 2021-10-05 LAB — NO CULTURE INDICATED

## 2021-10-05 LAB — TSH: TSH: 2.18 mIU/L

## 2021-10-05 LAB — T3, FREE: T3, Free: 3.5 pg/mL (ref 2.3–4.2)

## 2021-10-05 LAB — T4, FREE: Free T4: 1.1 ng/dL (ref 0.8–1.8)

## 2021-10-05 LAB — C-REACTIVE PROTEIN: CRP: 3.2 mg/L (ref <8.0)

## 2021-10-10 DIAGNOSIS — H5213 Myopia, bilateral: Secondary | ICD-10-CM | POA: Diagnosis not present

## 2021-10-20 DIAGNOSIS — Z3042 Encounter for surveillance of injectable contraceptive: Secondary | ICD-10-CM | POA: Diagnosis not present

## 2021-11-11 DIAGNOSIS — Z1283 Encounter for screening for malignant neoplasm of skin: Secondary | ICD-10-CM | POA: Diagnosis not present

## 2021-11-11 DIAGNOSIS — Z9189 Other specified personal risk factors, not elsewhere classified: Secondary | ICD-10-CM | POA: Diagnosis not present

## 2021-11-11 DIAGNOSIS — Z6829 Body mass index (BMI) 29.0-29.9, adult: Secondary | ICD-10-CM | POA: Diagnosis not present

## 2021-11-11 DIAGNOSIS — Z01419 Encounter for gynecological examination (general) (routine) without abnormal findings: Secondary | ICD-10-CM | POA: Diagnosis not present

## 2022-06-07 ENCOUNTER — Ambulatory Visit: Payer: BC Managed Care – PPO | Admitting: Orthopaedic Surgery

## 2022-06-22 DIAGNOSIS — N6452 Nipple discharge: Secondary | ICD-10-CM | POA: Diagnosis not present

## 2023-01-03 DIAGNOSIS — N6459 Other signs and symptoms in breast: Secondary | ICD-10-CM | POA: Diagnosis not present

## 2023-01-03 DIAGNOSIS — Z1283 Encounter for screening for malignant neoplasm of skin: Secondary | ICD-10-CM | POA: Diagnosis not present

## 2023-01-03 DIAGNOSIS — Z9189 Other specified personal risk factors, not elsewhere classified: Secondary | ICD-10-CM | POA: Diagnosis not present

## 2023-01-03 DIAGNOSIS — Z01419 Encounter for gynecological examination (general) (routine) without abnormal findings: Secondary | ICD-10-CM | POA: Diagnosis not present

## 2023-01-03 DIAGNOSIS — Z1331 Encounter for screening for depression: Secondary | ICD-10-CM | POA: Diagnosis not present

## 2023-01-05 DIAGNOSIS — N6459 Other signs and symptoms in breast: Secondary | ICD-10-CM | POA: Diagnosis not present

## 2023-01-05 DIAGNOSIS — N6452 Nipple discharge: Secondary | ICD-10-CM | POA: Diagnosis not present

## 2023-03-14 IMAGING — DX DG CHEST 2V
2 series · 2 of 2 positions shown · non-contrast
Comparison: None.

CLINICAL DATA: Night sweats and fatigue.

EXAM:
CHEST - 2 VIEW

[chest pa]
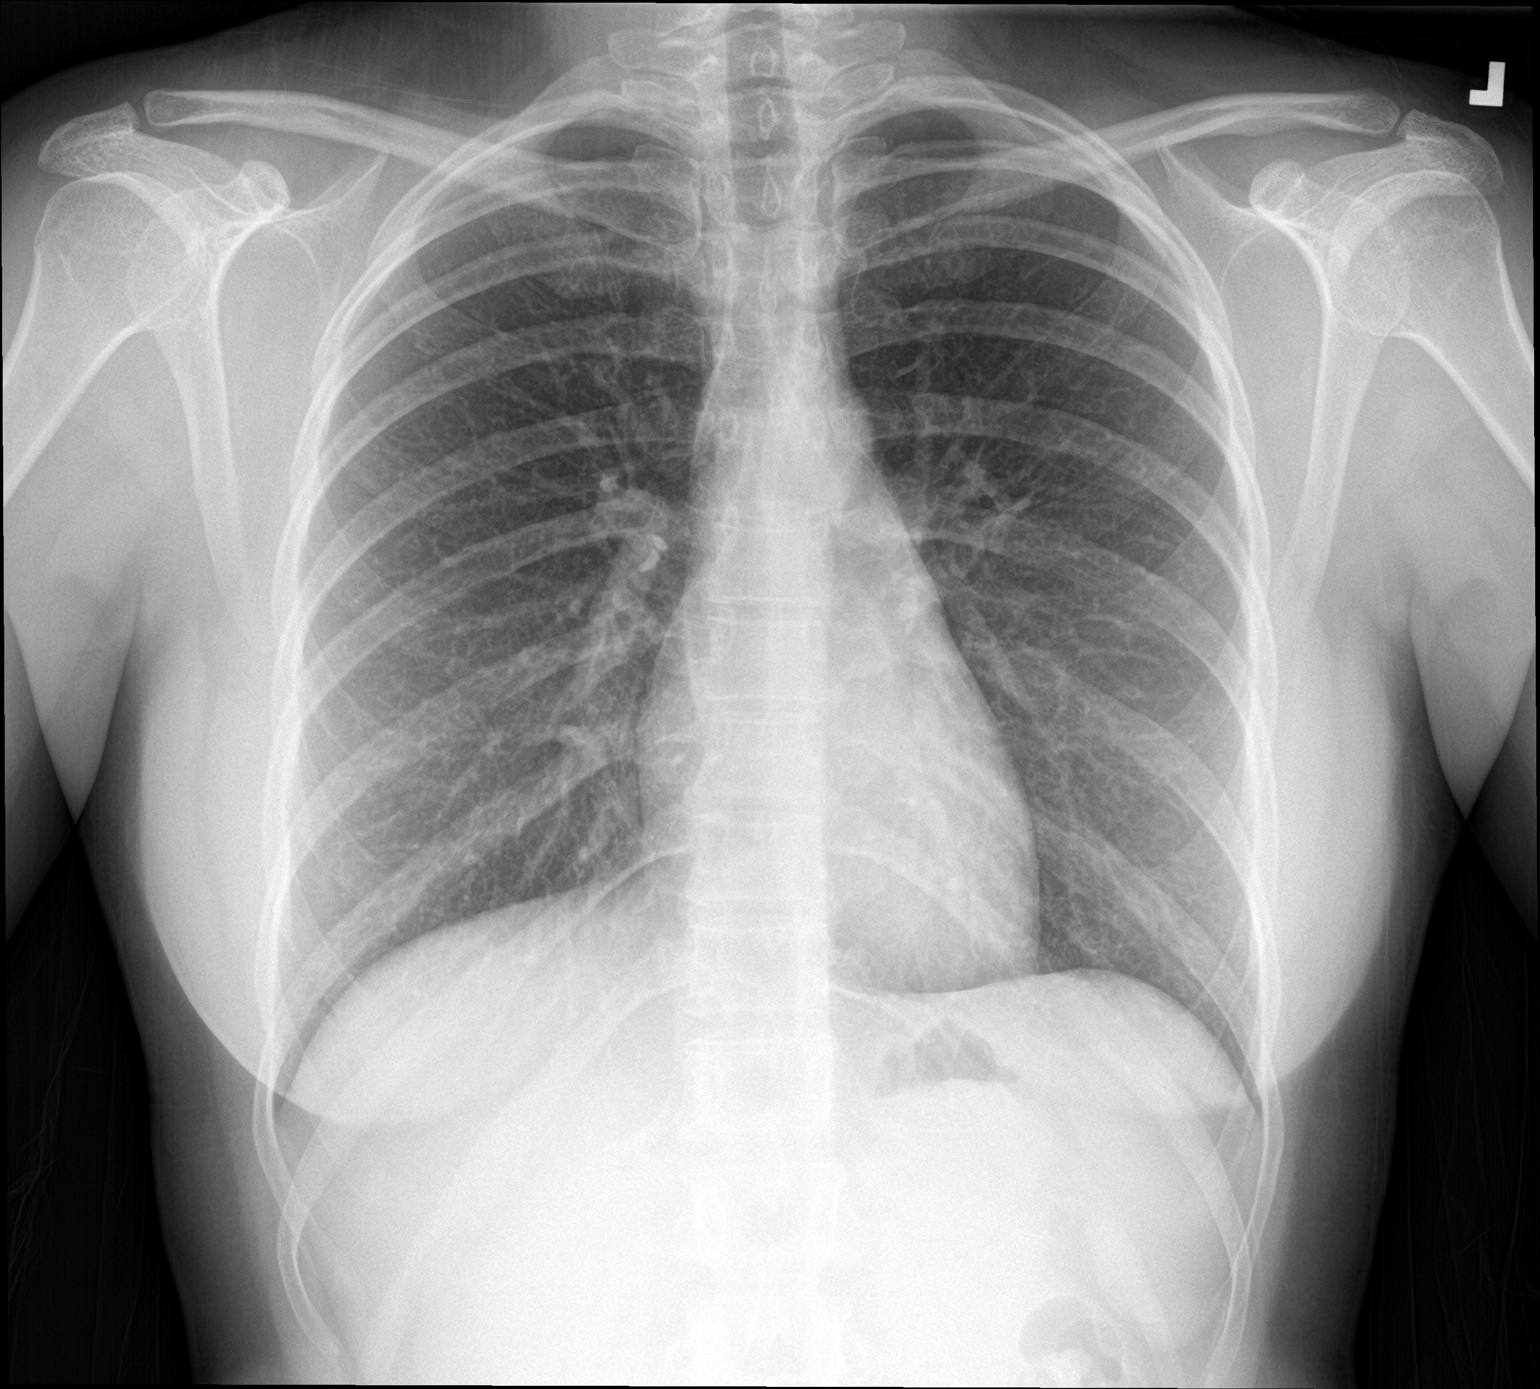

[chest lat]
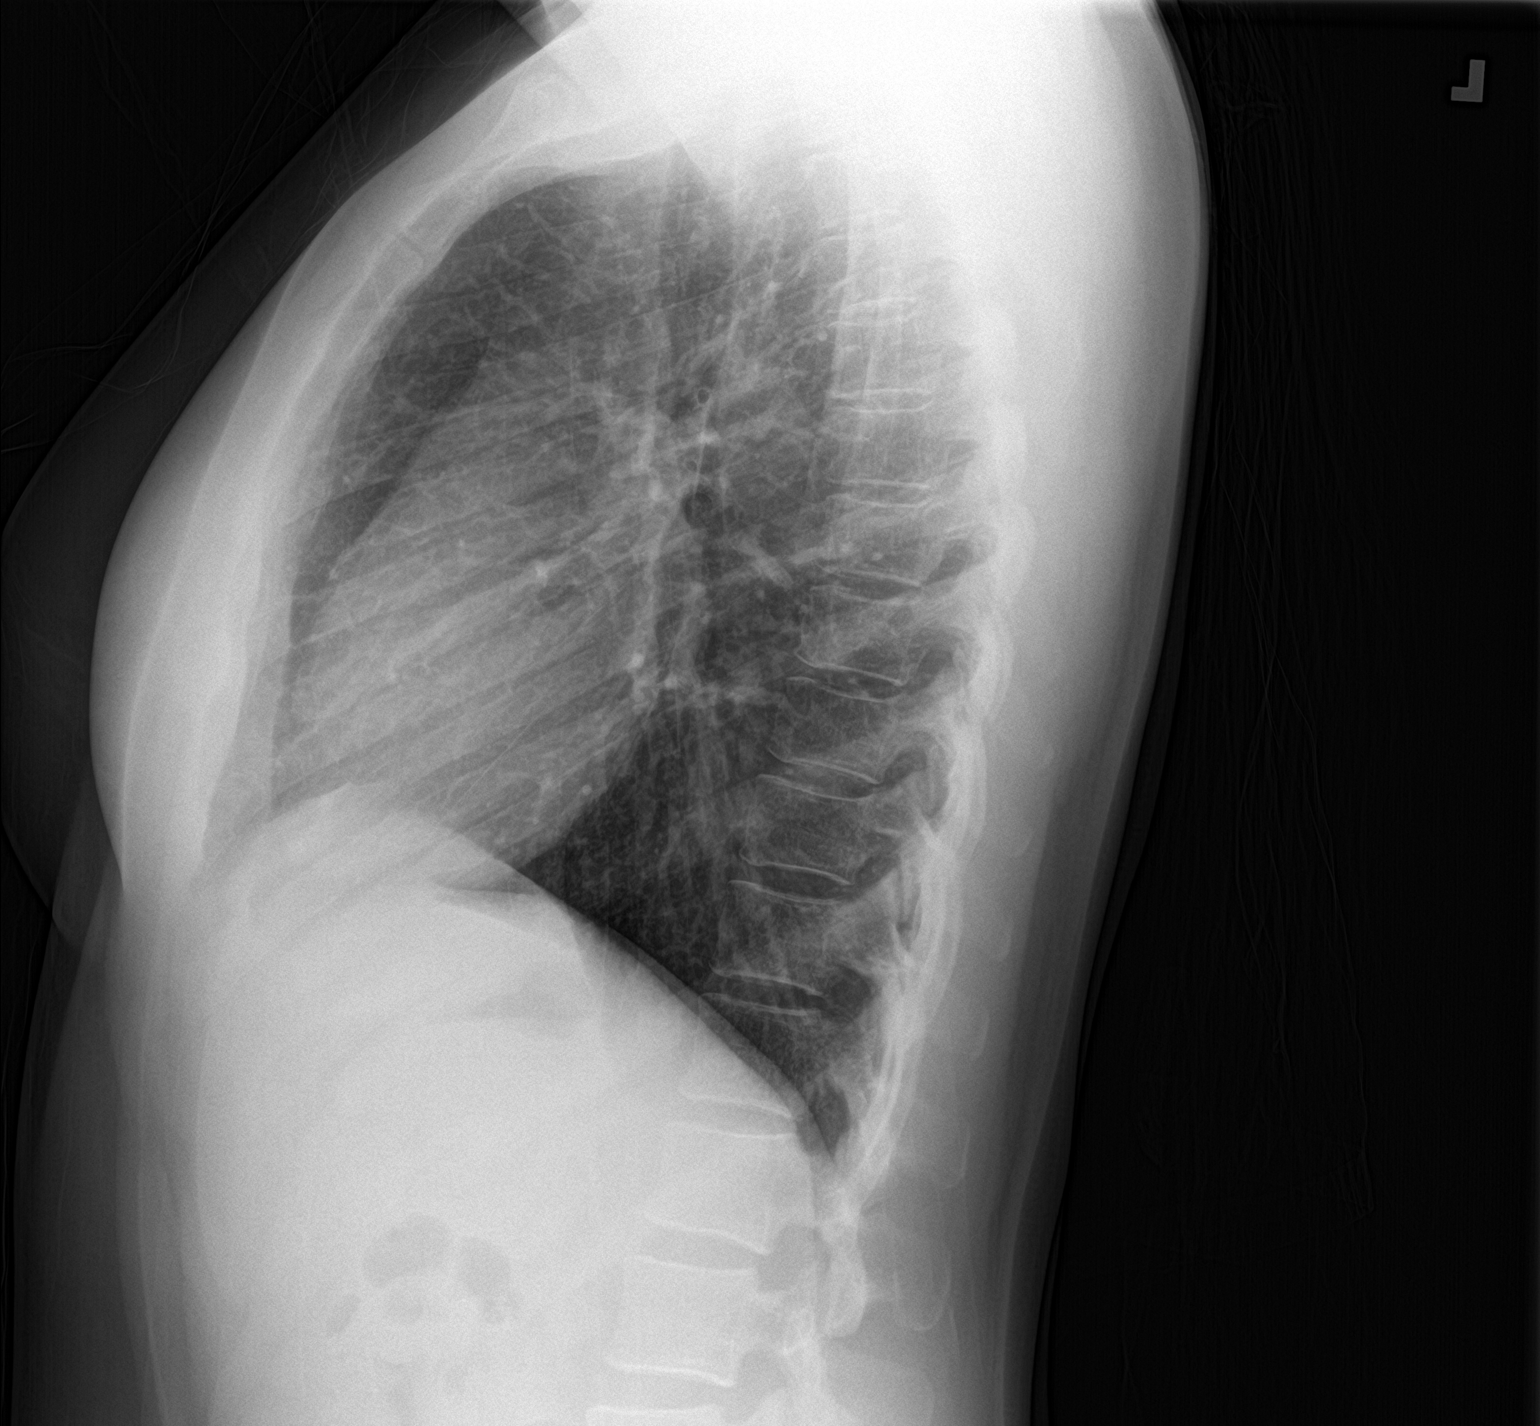

[2 of 2 positions shown; findings below may reference images not displayed]

FINDINGS: The heart size and mediastinal contours are within normal limits.
Both lungs are clear. The visualized skeletal structures are
unremarkable.
IMPRESSION: No active cardiopulmonary disease.

## 2023-04-09 DIAGNOSIS — D229 Melanocytic nevi, unspecified: Secondary | ICD-10-CM | POA: Diagnosis not present

## 2024-01-29 DIAGNOSIS — Z6831 Body mass index (BMI) 31.0-31.9, adult: Secondary | ICD-10-CM | POA: Diagnosis not present

## 2024-01-29 DIAGNOSIS — Z1331 Encounter for screening for depression: Secondary | ICD-10-CM | POA: Diagnosis not present

## 2024-01-29 DIAGNOSIS — Z01419 Encounter for gynecological examination (general) (routine) without abnormal findings: Secondary | ICD-10-CM | POA: Diagnosis not present

## 2024-01-29 DIAGNOSIS — Z124 Encounter for screening for malignant neoplasm of cervix: Secondary | ICD-10-CM | POA: Diagnosis not present

## 2024-01-29 DIAGNOSIS — Z113 Encounter for screening for infections with a predominantly sexual mode of transmission: Secondary | ICD-10-CM | POA: Diagnosis not present

## 2024-01-29 DIAGNOSIS — Z9189 Other specified personal risk factors, not elsewhere classified: Secondary | ICD-10-CM | POA: Diagnosis not present

## 2024-01-29 LAB — HM PAP SMEAR: Chlamydia, Swab/Urine, PCR: NEGATIVE

## 2024-03-05 ENCOUNTER — Ambulatory Visit (INDEPENDENT_AMBULATORY_CARE_PROVIDER_SITE_OTHER): Admitting: Family Medicine

## 2024-03-05 ENCOUNTER — Encounter: Payer: Self-pay | Admitting: Family Medicine

## 2024-03-05 VITALS — BP 111/73 | HR 67 | Ht 62.0 in | Wt 168.0 lb

## 2024-03-05 DIAGNOSIS — Z0000 Encounter for general adult medical examination without abnormal findings: Secondary | ICD-10-CM | POA: Diagnosis not present

## 2024-03-05 DIAGNOSIS — R110 Nausea: Secondary | ICD-10-CM | POA: Insufficient documentation

## 2024-03-05 DIAGNOSIS — R5383 Other fatigue: Secondary | ICD-10-CM | POA: Insufficient documentation

## 2024-03-05 MED ORDER — PANTOPRAZOLE SODIUM 40 MG PO TBEC: 40.0000 mg | DELAYED_RELEASE_TABLET | Freq: Every day | ORAL | 0 refills | Status: DC

## 2024-03-05 MED ORDER — ONDANSETRON 8 MG PO TBDP: 8.0000 mg | ORAL_TABLET | Freq: Three times a day (TID) | ORAL | 1 refills | Status: AC | PRN

## 2024-03-05 NOTE — Progress Notes (Signed)
 Joy Wells - 28 y.o. female MRN [id number]  Date of birth: Oct 24, 1995  Subjective Chief Complaint  Patient presents with   Fatigue   Nausea    HPI Joy Wells is a 28 y.o. female here today with complaint of fatigue as well as nausea.    She has had fatigue for about 3 months.  She denies any recent changes to diet, medications or supplements.  She feels like she is sleeping well.  Her menstrual cycles have been normal without excessive or prolonged bleeding.  She does not believe she is pregnant.    She does have some nausea as well as reflux symptoms.  Pain.  Bowels are moving normally.  Appetite is okay.  She does get occasional headaches.  ROS:  A comprehensive ROS was completed and negative except as noted per HPI  Past Medical History:  Diagnosis Date   Allergy    Asthma, exercise induced    Eczema     Past Surgical History:  Procedure Laterality Date   NO PAST SURGERIES      Social History   Socioeconomic History   Marital status: Married    Spouse name: Not on file   Number of children: Not on file   Years of education: Not on file   Highest education level: Not on file  Occupational History   Not on file  Tobacco Use   Smoking status: Never   Smokeless tobacco: Never  Substance and Sexual Activity   Alcohol use: Yes    Comment: 1 drink per month   Drug use: No   Sexual activity: Yes    Birth control/protection: Implant  Other Topics Concern   Not on file  Social History Narrative   Not on file   Social Drivers of Health   Financial Resource Strain: Low Risk  (01/29/2024)   Received from [facility]   Overall Financial Resource Strain (CARDIA)    Difficulty of Paying Living Expenses: Not hard at all  Food Insecurity: No Food Insecurity (01/29/2024)   Received from [facility]   Hunger Vital Sign    Within the past 12 months, you worried that your food would run out before you got the money to buy more.: Never true    Within the  past 12 months, the food you bought just didn't last and you didn't have money to get more.: Never true  Transportation Needs: No Transportation Needs (01/29/2024)   Received from [facility] - Transportation    Lack of Transportation (Medical): No    Lack of Transportation (Non-Medical): No  Physical Activity: Sufficiently Active (04/09/2023)   Received from [facility]   Exercise Vital Sign    On average, how many days per week do you engage in moderate to strenuous exercise (like a brisk walk)?: 5 days    On average, how many minutes do you engage in exercise at this level?: 50 min  Stress: No Stress Concern Present (04/09/2023)   Received from [facility] - Occupational Stress Questionnaire    Feeling of Stress : Not at all  Social Connections: Socially Integrated (04/09/2023)   Received from [facility]   Social Network    How would you rate your social network (family, work, friends)?: Good participation with social networks    Family History  Problem Relation Age of Onset   Hashimoto's thyroiditis Mother    Hypertension Father    Diabetes Father  Heart attack Neg Hx    Stroke Neg Hx    Cancer Neg Hx     Health Maintenance  Topic Date Due   HIV Screening  Never done   Hepatitis C Screening  Never done   Pneumococcal Vaccine 57-13 Years old (1 of 2 - PCV) Never done   Hepatitis B Vaccines (1 of 3 - 19+ 3-dose series) Never done   HPV VACCINES (1 - 3-dose SCDM series) Never done   COVID-19 Vaccine (1 - 2024-25 season) Never done   Cervical Cancer Screening (Pap smear)  10/14/2023   INFLUENZA VACCINE  03/14/2024   DTaP/Tdap/Td (3 - Td or Tdap) 02/09/2025   Meningococcal B Vaccine  Aged Out     ----------------------------------------------------------------------------------------------------------------------------------------------------------------------------------------------------------------- Physical  Exam BP 111/73 (BP Location: Left Arm, Patient Position: Sitting, Cuff Size: Normal)   Pulse 67   Ht 5' 2 (1.575 m)   Wt 168 lb (76.2 kg)   SpO2 98%   BMI 30.73 kg/m   Physical Exam Constitutional:      Appearance: Normal appearance.  HENT:     Head: Normocephalic and atraumatic.  Cardiovascular:     Rate and Rhythm: Normal rate and regular rhythm.  Pulmonary:     Effort: Pulmonary effort is normal.     Breath sounds: Normal breath sounds.  Musculoskeletal:     Cervical back: Neck supple.  Neurological:     General: No focal deficit present.     Mental Status: She is alert.  Psychiatric:        Mood and Affect: Mood normal.        Behavior: Behavior normal.     ------------------------------------------------------------------------------------------------------------------------------------------------------------------------------------------------------------------- Assessment and Plan  Fatigue Orders Placed This Encounter  Procedures   CMP14+EGFR   CBC with Differential/Platelet   TSH + free T4   Iron, TIBC and Ferritin Panel   Vitamin D  (25 hydroxy)   B12     Nausea She does have some reflux symptoms.  Adding pantoprazole  over the next couple weeks.  Zofran  as needed for nausea.  Referral to gastroenterology if not improving.   Meds ordered this encounter  Medications   pantoprazole  (PROTONIX ) 40 MG tablet    Sig: Take 1 tablet (40 mg total) by mouth daily.    Dispense:  30 tablet    Refill:  0   ondansetron  (ZOFRAN -ODT) 8 MG disintegrating tablet    Sig: Take 1 tablet (8 mg total) by mouth every 8 (eight) hours as needed for nausea.    Dispense:  20 tablet    Refill:  1    Return in about 6 weeks (around 04/16/2024) for Nausea/Fatigue.

## 2024-03-05 NOTE — Assessment & Plan Note (Signed)
 She does have some reflux symptoms.  Adding pantoprazole  over the next couple weeks.  Zofran  as needed for nausea.  Referral to gastroenterology if not improving.

## 2024-03-05 NOTE — Assessment & Plan Note (Signed)
 Orders Placed This Encounter  Procedures   CMP14+EGFR   CBC with Differential/Platelet   TSH + free T4   Iron, TIBC and Ferritin Panel   Vitamin D  (25 hydroxy)   B12

## 2024-03-06 LAB — CMP14+EGFR
ALT: 34 IU/L — ABNORMAL HIGH (ref 0–32)
AST: 31 IU/L (ref 0–40)
Albumin: 4.4 g/dL (ref 4.0–5.0)
Alkaline Phosphatase: 83 IU/L (ref 44–121)
BUN/Creatinine Ratio: 18 (ref 9–23)
BUN: 15 mg/dL (ref 6–20)
Bilirubin Total: 0.6 mg/dL (ref 0.0–1.2)
CO2: 19 mmol/L — ABNORMAL LOW (ref 20–29)
Calcium: 9.3 mg/dL (ref 8.7–10.2)
Chloride: 102 mmol/L (ref 96–106)
Creatinine, Ser: 0.83 mg/dL (ref 0.57–1.00)
Globulin, Total: 2.8 g/dL (ref 1.5–4.5)
Glucose: 93 mg/dL (ref 70–99)
Potassium: 4.4 mmol/L (ref 3.5–5.2)
Sodium: 137 mmol/L (ref 134–144)
Total Protein: 7.2 g/dL (ref 6.0–8.5)
eGFR: 98 mL/min/1.73 (ref >59)

## 2024-03-06 LAB — CBC WITH DIFFERENTIAL/PLATELET
Basophils Absolute: 0.1 x10E3/uL (ref 0.0–0.2)
Basos: 1 %
EOS (ABSOLUTE): 0.3 x10E3/uL (ref 0.0–0.4)
Eos: 3 %
Hematocrit: 42.5 % (ref 34.0–46.6)
Hemoglobin: 13.9 g/dL (ref 11.1–15.9)
Immature Grans (Abs): 0 x10E3/uL (ref 0.0–0.1)
Immature Granulocytes: 0 %
Lymphocytes Absolute: 2.3 x10E3/uL (ref 0.7–3.1)
Lymphs: 29 %
MCH: 29.6 pg (ref 26.6–33.0)
MCHC: 32.7 g/dL (ref 31.5–35.7)
MCV: 90 fL (ref 79–97)
Monocytes Absolute: 0.7 x10E3/uL (ref 0.1–0.9)
Monocytes: 8 %
Neutrophils Absolute: 4.7 x10E3/uL (ref 1.4–7.0)
Neutrophils: 59 %
Platelets: 228 x10E3/uL (ref 150–450)
RBC: 4.7 x10E6/uL (ref 3.77–5.28)
RDW: 12.1 % (ref 11.7–15.4)
WBC: 8 x10E3/uL (ref 3.4–10.8)

## 2024-03-06 LAB — TSH+FREE T4
Free T4: 1.02 ng/dL (ref 0.82–1.77)
TSH: 2.18 u[IU]/mL (ref 0.450–4.500)

## 2024-03-06 LAB — VITAMIN B12: Vitamin B-12: 714 pg/mL (ref 232–1245)

## 2024-03-06 LAB — VITAMIN D 25 HYDROXY (VIT D DEFICIENCY, FRACTURES): Vit D, 25-Hydroxy: 37.9 ng/mL (ref 30.0–100.0)

## 2024-03-13 ENCOUNTER — Ambulatory Visit: Payer: Self-pay | Admitting: Family Medicine

## 2024-03-25 ENCOUNTER — Other Ambulatory Visit: Payer: Self-pay | Admitting: Family Medicine

## 2024-03-25 DIAGNOSIS — R5383 Other fatigue: Secondary | ICD-10-CM | POA: Diagnosis not present

## 2024-03-26 LAB — IRON,TIBC AND FERRITIN PANEL
Ferritin: 88 ng/mL (ref 15–150)
Iron Saturation: 35 % (ref 15–55)
Iron: 110 ug/dL (ref 27–159)
Total Iron Binding Capacity: 317 ug/dL (ref 250–450)
UIBC: 207 ug/dL (ref 131–425)

## 2024-03-28 ENCOUNTER — Ambulatory Visit: Payer: Self-pay | Admitting: Family Medicine

## 2024-04-04 ENCOUNTER — Other Ambulatory Visit: Payer: Self-pay

## 2024-04-04 ENCOUNTER — Other Ambulatory Visit: Payer: Self-pay | Admitting: Family Medicine

## 2024-04-15 ENCOUNTER — Ambulatory Visit: Admitting: Family Medicine

## 2024-04-29 ENCOUNTER — Ambulatory Visit (INDEPENDENT_AMBULATORY_CARE_PROVIDER_SITE_OTHER)

## 2024-04-29 ENCOUNTER — Ambulatory Visit (INDEPENDENT_AMBULATORY_CARE_PROVIDER_SITE_OTHER): Admitting: Family Medicine

## 2024-04-29 ENCOUNTER — Encounter: Payer: Self-pay | Admitting: Family Medicine

## 2024-04-29 VITALS — BP 118/80 | HR 73 | Ht 62.0 in | Wt 169.0 lb

## 2024-04-29 DIAGNOSIS — R232 Flushing: Secondary | ICD-10-CM

## 2024-04-29 DIAGNOSIS — R61 Generalized hyperhidrosis: Secondary | ICD-10-CM

## 2024-04-29 DIAGNOSIS — R110 Nausea: Secondary | ICD-10-CM | POA: Diagnosis not present

## 2024-04-29 DIAGNOSIS — R5383 Other fatigue: Secondary | ICD-10-CM

## 2024-04-29 DIAGNOSIS — J45909 Unspecified asthma, uncomplicated: Secondary | ICD-10-CM | POA: Diagnosis not present

## 2024-04-29 NOTE — Progress Notes (Signed)
 Joy Wells - 28 y.o. female MRN [id number]  Date of birth: 07-Sep-1995  Subjective Chief Complaint  Patient presents with   Excessive Sweating   Fatigue   disorientation    HPI Joy Wells is a 28 year old female here today for follow-up visit.  She continues to experience episodes of sweating along with feeling of flushing and nausea.  Episodes of sweats were just occurring at night and now they are occurring during the day.  She has had some fatigue associated with this as well.  She has not really noticed any elevations in her blood pressure.  She does get headaches at times she has the symptoms.  So far been unremarkable.  She denies dyspnea, chest pain or significant weight change.  ROS:  A comprehensive ROS was completed and negative except as noted per HPI  No Known Allergies   Past Medical History:  Diagnosis Date   Allergy    Asthma, exercise induced    Eczema     Past Surgical History:  Procedure Laterality Date   NO PAST SURGERIES      Social History   Socioeconomic History   Marital status: Married    Spouse name: Not on file   Number of children: Not on file   Years of education: Not on file   Highest education level: Not on file  Occupational History   Not on file  Tobacco Use   Smoking status: Never   Smokeless tobacco: Never  Substance and Sexual Activity   Alcohol use: Yes    Comment: 1 drink per month   Drug use: No   Sexual activity: Yes    Birth control/protection: Implant  Other Topics Concern   Not on file  Social History Narrative   Not on file   Social Drivers of Health   Financial Resource Strain: Low Risk  (01/29/2024)   Received from [facility]   Overall Financial Resource Strain (CARDIA)    Difficulty of Paying Living Expenses: Not hard at all  Food Insecurity: No Food Insecurity (01/29/2024)   Received from [facility]   Hunger Vital Sign    Within the past 12 months, you worried that your food would run out  before you got the money to buy more.: Never true    Within the past 12 months, the food you bought just didn't last and you didn't have money to get more.: Never true  Transportation Needs: No Transportation Needs (01/29/2024)   Received from [facility] - Transportation    Lack of Transportation (Medical): No    Lack of Transportation (Non-Medical): No  Physical Activity: Sufficiently Active (04/09/2023)   Received from [facility]   Exercise Vital Sign    On average, how many days per week do you engage in moderate to strenuous exercise (like a brisk walk)?: 5 days    On average, how many minutes do you engage in exercise at this level?: 50 min  Stress: No Stress Concern Present (04/09/2023)   Received from [facility] - Occupational Stress Questionnaire    Feeling of Stress : Not at all  Social Connections: Socially Integrated (04/09/2023)   Received from [facility]   Social Network    How would you rate your social network (family, work, friends)?: Good participation with social networks    Family History  Problem Relation Age of Onset   Hashimoto's thyroiditis Mother    Hypertension Father  Diabetes Father    Heart attack Neg Hx    Stroke Neg Hx    Cancer Neg Hx     Health Maintenance  Topic Date Due   Influenza Vaccine  11/11/2024 (Originally 03/14/2024)   Pneumococcal Vaccine (1 of 2 - PCV) 04/29/2025 (Originally 03/15/2013)   Hepatitis B Vaccines 19-59 Average Risk (1 of 3 - 19+ 3-dose series) 04/29/2025 (Originally 03/15/2013)   HPV VACCINES (1 - 3-dose SCDM series) 04/29/2025 (Originally 03/15/2021)   Hepatitis C Screening  04/29/2025 (Originally 03/15/2012)   HIV Screening  04/29/2025 (Originally 03/15/2009)   COVID-19 Vaccine (1 - 2024-25 season) 05/15/2025 (Originally 04/14/2024)   DTaP/Tdap/Td (3 - Td or Tdap) 02/09/2025   Cervical Cancer Screening (HPV/Pap Cotest)  01/29/2027   Meningococcal B Vaccine  Aged Out      ----------------------------------------------------------------------------------------------------------------------------------------------------------------------------------------------------------------- Physical Exam BP 118/80 (BP Location: Left Arm, Patient Position: Sitting, Cuff Size: Normal)   Pulse 73   Ht 5' 2 (1.575 m)   Wt 169 lb (76.7 kg)   SpO2 98%   BMI 30.91 kg/m   Physical Exam Constitutional:      Appearance: Normal appearance.  HENT:     Head: Normocephalic and atraumatic.  Eyes:     General: No scleral icterus. Cardiovascular:     Rate and Rhythm: Normal rate and regular rhythm.  Pulmonary:     Effort: Pulmonary effort is normal.     Breath sounds: Normal breath sounds.  Musculoskeletal:     Cervical back: Neck supple.  Neurological:     General: No focal deficit present.     Mental Status: She is alert.  Psychiatric:        Mood and Affect: Mood normal.        Behavior: Behavior normal.     ------------------------------------------------------------------------------------------------------------------------------------------------------------------------------------------------------------------- Assessment and Plan  Fatigue She continues to have fatigue as well as episodes of flushing and sweats.  Some headaches associated with this as well.  Will initiate workup for pheochromocytoma as well as as carcinoid.  Chest x-ray ordered.  There is possibility of this being related to postviral/ post-COVID syndrome.   No orders of the defined types were placed in this encounter.   No follow-ups on file.

## 2024-04-29 NOTE — Assessment & Plan Note (Signed)
 She continues to have fatigue as well as episodes of flushing and sweats.  Some headaches associated with this as well.  Will initiate workup for pheochromocytoma as well as as carcinoid.  Chest x-ray ordered.  There is possibility of this being related to postviral/ post-COVID syndrome.

## 2024-05-02 ENCOUNTER — Ambulatory Visit: Payer: Self-pay | Admitting: Family Medicine

## 2024-05-02 DIAGNOSIS — R61 Generalized hyperhidrosis: Secondary | ICD-10-CM | POA: Diagnosis not present

## 2024-05-02 DIAGNOSIS — R5383 Other fatigue: Secondary | ICD-10-CM | POA: Diagnosis not present

## 2024-05-02 DIAGNOSIS — R232 Flushing: Secondary | ICD-10-CM | POA: Diagnosis not present

## 2024-05-06 DIAGNOSIS — R232 Flushing: Secondary | ICD-10-CM | POA: Diagnosis not present

## 2024-05-06 DIAGNOSIS — R5383 Other fatigue: Secondary | ICD-10-CM | POA: Diagnosis not present

## 2024-05-06 DIAGNOSIS — R61 Generalized hyperhidrosis: Secondary | ICD-10-CM | POA: Diagnosis not present

## 2024-05-06 LAB — EPSTEIN-BARR VIRUS (EBV) ANTIBODY PROFILE
EBV NA IgG: >600 U/mL — ABNORMAL HIGH (ref 0.0–17.9)
EBV VCA IgG: >600 U/mL — ABNORMAL HIGH (ref 0.0–17.9)
EBV VCA IgM: <36 U/mL (ref 0.0–35.9)

## 2024-05-06 LAB — CATECHOLAMINES, FRACTIONATED, PLASMA
Dopamine: <10 pg/mL (ref 0.0–36.7)
Epinephrine: 22.1 pg/mL (ref 0.0–55.4)
Norepinephrine: 308 pg/mL (ref 115–524)

## 2024-05-07 LAB — SPECIMEN STATUS REPORT

## 2024-05-07 LAB — 5 HIAA, QUANTITATIVE, URINE, 24 HOUR: 5-HIAA, Ur: 3.8 mg/L

## 2024-05-10 LAB — CATECHOLAMINES, FRACTIONATED, URINE, 24 HOUR
Dopamine, Rand Ur: 281 ug/L
Epinephrine, Rand Ur: <3 ug/L
Norepinephrine, Rand Ur: 29 ug/L

## 2024-05-10 LAB — SPECIMEN STATUS REPORT

## 2024-05-11 LAB — SPECIMEN STATUS REPORT

## 2024-05-11 LAB — METANEPHRINES, URINE, 24 HOUR
Metaneph Total, Ur: 75 ug/L
Metanephrines, 24H Ur: 75 ug/(24.h) (ref 36–209)
Normetanephrine, 24H Ur: 290 ug/(24.h) (ref 95–449)
Normetanephrine, Ur: 290 ug/L

## 2024-07-07 ENCOUNTER — Other Ambulatory Visit: Payer: Self-pay

## 2024-07-07 MED ORDER — PANTOPRAZOLE SODIUM 40 MG PO TBEC: 40.0000 mg | DELAYED_RELEASE_TABLET | Freq: Every day | ORAL | 0 refills | Status: AC | PRN
